# Patient Record
Sex: Male | Born: 1985 | Race: White | Hispanic: No | Marital: Single | Smoking: Current every day smoker
Health system: Southern US, Community
[De-identification: ages and names within clinical notes are randomized; demographics above are authoritative.]

## PROBLEM LIST (undated history)

## (undated) DIAGNOSIS — G5600 Carpal tunnel syndrome, unspecified upper limb: Secondary | ICD-10-CM

## (undated) DIAGNOSIS — F1111 Opioid abuse, in remission: Secondary | ICD-10-CM

## (undated) HISTORY — PX: HAND TENDON SURGERY: SHX663

## (undated) HISTORY — PX: ANKLE SURGERY: SHX546

## (undated) HISTORY — PX: MOUTH SURGERY: SHX715

---
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
## 2003-06-02 ENCOUNTER — Emergency Department (HOSPITAL_COMMUNITY): Admission: EM | Admit: 2003-06-02 | Discharge: 2003-06-02 | Payer: Self-pay | Admitting: Emergency Medicine

## 2010-01-19 ENCOUNTER — Ambulatory Visit
Admission: RE | Admit: 2010-01-19 | Discharge: 2010-01-19 | Payer: Self-pay | Source: Home / Self Care | Attending: Orthopedic Surgery | Admitting: Orthopedic Surgery

## 2010-01-29 NOTE — Op Note (Signed)
NAMEHARON, Leon [name]                    ACCOUNT NO.:  [id number]  MEDICAL RECORD NO.:  [id number]          PATIENT TYPE:  AMB  LOCATION:  DSC                          FACILITY:  [facility]  PHYSICIAN:  Betha Loa, MD        DATE OF BIRTH:  Sep 08, 1985  DATE OF PROCEDURE:  01/19/2010 DATE OF DISCHARGE:                              OPERATIVE REPORT   PREOPERATIVE DIAGNOSIS:  Right thumb digital nerve lacerations.  POSTOPERATIVE DIAGNOSIS:  Right thumb ulnar digital nerve laceration.  PROCEDURE:  Right thumb ulnar digital nerve repair.  SURGEON:  Betha Loa, MD  ASSISTANT:  Cindee Salt, MD  ANESTHESIA:  General.  IV FLUIDS:  Per anesthesia flow sheet.  ESTIMATED BLOOD LOSS:  Minimal.  COMPLICATIONS:  None.  SPECIMENS:  None.  TOURNIQUET TIME:  Forty four minutes.  DISPOSITION:  Stable to PACU.  INDICATIONS:  [name] is a 25 year old white male who last Thursday was at work with a piece of glass and the glass slipped lacerating the base of his right thumb.  He was seen at an outside facility and transferred to my office for my evaluation.  He had a laceration at the ulnar base of the thumb near the level of the MP joint.  He stated he had decreased sensation in the thumb tip.  He had good capillary refill.  His wound was irrigated and two sutures were placed.  He was followed up on Monday for reevaluation.  On reevaluation, he still had numbness in the tip of the thumb.  I recommended him going to the operating room for exploration of the wound and potential repair of nerve lacerations. Risks, benefits, and alternatives of surgery were discussed including risk of blood loss, infection, damage to nerves, vessels, tendons, ligaments, bone, failure of surgery, need for additional surgery, complications, wound healing, continued pain, continued numbness.  He voiced understanding of these risks and elected to proceed.  OPERATIVE COURSE:  After being identified preoperatively by  myself, the patient and I agreed upon the procedure and site of procedure.  The surgical site was marked.  The risks, benefits, and alternatives of surgery were reviewed and wished to proceed.  Surgical consent had been signed.  He was given 1 g of IV Ancef as a preoperative antibiotic prophylaxis.  He was transported to the operating room and placed on the operating table in supine position with the right upper extremity on armboard.  General anesthesia was induced by the anesthesiologist.  The right upper extremity was prepped and draped in normal sterile orthopedic fashion using Betadine scrub and paint.  Surgical pause was performed between surgeons, Anesthesia, operating staff, and all were in agreement as to the patient, procedure, and site of procedure. Tourniquet at the proximal aspect of the extremity was inflated to 250 mmHg after exsanguination of the limb with an Esmarch bandage.  The traumatic wound was opened.  There was noted to be a nearly complete laceration of the ulnar digital nerve to the thumb.  The wound was extended distally at the ulnar side and proximally at the radial side. This allowed  better visualization.  The radial digital nerve was identified and was intact throughout the zone of injury.  The princeps pollicis artery was identified and was intact.  The microscope was brought in.  The nerve ends were cleared.  A 9-0 suture was used in an interrupted fashion circumferentially to repair the nerve ends together. This provided appropriate approximation of the nerve ends.  Four sutures total were placed.  The wound was then copiously irrigated with sterile saline.  It was closed using 4-0 nylon in a horizontal mattress fashion. The wound was then injected with 3.5 mL of 0.25% plain Marcaine.  It was dressed with sterile Xeroform, 4 x 4's, and wrapped with Kerlix.  A thumb spica splint was placed and wrapped with Kerlix and Ace bandage. The tourniquet was deflated  at 44 minutes.  The thumb was pink with brisk capillary refill after deflation of the tourniquet.  Operative drapings were broken down.  The patient was awoken from anesthesia safely.  He was transferred back to the stretcher and taken to PACU in stable condition.  I will see him in the office in 1 week for postoperative follow up.  I will give him Percocet 5/325 one to two p.o. q.6 h. p.r.n. pain, dispense #40 for pain control.     Betha Loa, MD     KK/MEDQ  D:  01/19/2010  T:  01/20/2010  Job:  [id number]  Electronically Signed by Betha Loa  on 01/29/2010 04:00:25 PM

## 2014-06-10 ENCOUNTER — Emergency Department (HOSPITAL_BASED_OUTPATIENT_CLINIC_OR_DEPARTMENT_OTHER)
Admission: EM | Admit: 2014-06-10 | Discharge: 2014-06-10 | Disposition: A | Discharge status: Home / Self Care | Payer: Self-pay | Attending: Emergency Medicine | Admitting: Emergency Medicine

## 2014-06-10 ENCOUNTER — Encounter (HOSPITAL_BASED_OUTPATIENT_CLINIC_OR_DEPARTMENT_OTHER): Payer: Self-pay | Admitting: Emergency Medicine

## 2014-06-10 DIAGNOSIS — G5602 Carpal tunnel syndrome, left upper limb: Secondary | ICD-10-CM | POA: Insufficient documentation

## 2014-06-10 DIAGNOSIS — Z720 Tobacco use: Secondary | ICD-10-CM | POA: Insufficient documentation

## 2014-06-10 HISTORY — DX: Carpal tunnel syndrome, unspecified upper limb: G56.00

## 2014-06-10 MED ORDER — HYDROCODONE-ACETAMINOPHEN 5-325 MG PO TABS: 2.0000 | ORAL_TABLET | ORAL | Status: AC | PRN

## 2014-06-10 NOTE — Discharge Instructions (Signed)
Carpal Tunnel Syndrome The carpal tunnel is a narrow area located on the palm side of your wrist. The tunnel is formed by the wrist bones and ligaments. Nerves, blood vessels, and tendons pass through the carpal tunnel. Repeated wrist motion or certain diseases may cause swelling within the tunnel. This swelling pinches the main nerve in the wrist (median nerve) and causes the painful hand and arm condition called carpal tunnel syndrome. CAUSES   Repeated wrist motions.  Wrist injuries.  Certain diseases like arthritis, diabetes, alcoholism, hyperthyroidism, and kidney failure.  Obesity.  Pregnancy. SYMPTOMS   A "pins and needles" feeling in your fingers or hand, especially in your thumb, index and middle fingers.  Tingling or numbness in your fingers or hand.  An aching feeling in your entire arm, especially when your wrist and elbow are bent for long periods of time.  Wrist pain that goes up your arm to your shoulder.  Pain that goes down into your palm or fingers.  A weak feeling in your hands. DIAGNOSIS  Your health care provider will take your history and perform a physical exam. An electromyography test may be needed. This test measures electrical signals sent out by your nerves into the muscles. The electrical signals are usually slowed by carpal tunnel syndrome. You may also need X-rays. TREATMENT  Carpal tunnel syndrome may clear up by itself. Your health care provider may recommend a wrist splint or medicine such as a nonsteroidal anti-inflammatory medicine. Cortisone injections may help. Sometimes, surgery may be needed to free the pinched nerve.  HOME CARE INSTRUCTIONS   Take all medicine as directed by your health care provider. Only take over-the-counter or prescription medicines for pain, discomfort, or fever as directed by your health care provider.  If you were given a splint to keep your wrist from bending, wear it as directed. It is important to wear the splint at  night. Wear the splint for as long as you have pain or numbness in your hand, arm, or wrist. This may take 1 to 2 months.  Rest your wrist from any activity that may be causing your pain. If your symptoms are work-related, you may need to talk to your employer about changing to a job that does not require using your wrist.  Put ice on your wrist after long periods of wrist activity.  Put ice in a plastic bag.  Place a towel between your skin and the bag.  Leave the ice on for 15-20 minutes, 03-04 times a day.  Keep all follow-up visits as directed by your health care provider. This includes any orthopedic referrals, physical therapy, and rehabilitation. Any delay in getting necessary care could result in a delay or failure of your condition to heal. SEEK IMMEDIATE MEDICAL CARE IF:   You have new, unexplained symptoms.  Your symptoms get worse and are not helped or controlled with medicines. MAKE SURE YOU:   Understand these instructions.  Will watch your condition.  Will get help right away if you are not doing well or get worse. Document Released: 12/25/1999 Document Revised: 05/13/2013 Document Reviewed: 11/12/2010 ExitCare Patient Information 2015 [employer]. This information is not intended to replace advice given to you by your health care provider. Make sure you discuss any questions you have with your health care provider.  

## 2014-06-10 NOTE — ED Notes (Signed)
Pt called to registration x 2 no answer

## 2014-06-10 NOTE — ED Notes (Signed)
Pt in c/o hiccups x 1 day, but biggest complaint is arm pain from carpal tunnel. Has been seen for this and has appt with specialist, but pain is worse now.

## 2014-06-10 NOTE — ED Provider Notes (Signed)
CSN: [id number]     Arrival date & time 06/10/14  1951 History  This chart was scribed for Leon Nayobert Elmo Shumard, MD by Phillis HaggisGabriella Gaje, ED Scribe. This patient was seen in room MH07/MH07 and patient care was started at 10:29 PM.   Chief Complaint  Patient presents with  . Hiccups  . Arm Pain   The history is provided by the patient. No language interpreter was used.  HPI Comments: Leon [name] is a 29 y.o. male with a history of carpal tunnel who presents to the Emergency Department complaining of throbbing left arm and hand pain onset 2 days ago. He reports that pain worsens with moving his fingers, to palpation, or when he hangs his hand downwards. He states that he has previously seen a hand specialist 2 years ago but reports that his symptoms got better but he stopped going. He states that he has an appointment with a specialist on Monday in [place] but could not wait due to pain. 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 He reports that Tylenol #3 typically works for this type of pain.   Past Medical History  Diagnosis Date  . Carpal tunnel syndrome    Past Surgical History  Procedure Laterality Date  . Ankle surgery    . Mouth surgery    . Hand tendon surgery     History reviewed. No pertinent family history. History  Substance Use Topics  . Smoking status: Current Every Day Smoker  . Smokeless tobacco: Never Used  . Alcohol Use: No    Review of Systems A complete 10 system review of systems was obtained and all systems are negative except as noted in the HPI and PMH.   Allergies  Review of patient's allergies indicates no known allergies.  Home Medications   Prior to Admission medications   Medication Sig Start Date End Date Taking? Authorizing Provider  HYDROcodone-acetaminophen (NORCO/VICODIN) 5-325 MG per tablet Take 2 tablets by mouth every 4 (four) hours as needed. 06/10/14   Leon Nayobert Shantel Helwig,  MD   BP 135/87 mmHg  Pulse 67  Temp(Src) 98.3 F (36.8 C) (Oral)  Resp 20  Ht 6' (1.829 m)  Wt 180 lb (81.647 kg)  BMI 24.41 kg/m2  SpO2 100% Physical Exam  Constitutional: He is oriented to person, place, and time. He appears well-developed and well-nourished. No distress.  HENT:  Head: Normocephalic and atraumatic.  Eyes: Pupils are equal, round, and reactive to light.  Neck: Normal range of motion.  Cardiovascular: Normal rate and intact distal pulses.   Pulses:      Radial pulses are 2+ on the right side, and 2+ on the left side.       Posterior tibial pulses are 2+ on the right side, and 2+ on the left side.  Pulmonary/Chest: No respiratory distress.  Abdominal: Normal appearance. He exhibits no distension. There is no tenderness.  Musculoskeletal: Normal range of motion.  Neurological: He is alert and oriented to person, place, and time. No cranial nerve deficit.  Skin: Skin is warm and dry. No rash noted.  Psychiatric: He has a normal mood and affect. His behavior is normal.  Nursing note and vitals reviewed.   ED Course  Procedures (including critical care time) DIAGNOSTIC STUDIES: Oxygen Saturation is 100% on room air, normal by my interpretation.    COORDINATION OF CARE: 10:33 PM-Discussed treatment plan which includes hydrocodone with pt at  bedside and pt agreed to plan.   Labs Review Labs Reviewed - No data to display    MDM   Final diagnoses:  Carpal tunnel syndrome of left wrist    I personally performed the services described in this documentation, which was scribed in my presence. The recorded information has been reviewed and considered.    Leon Nay, MD 06/18/14 [phone]

## 2014-08-08 ENCOUNTER — Encounter (HOSPITAL_COMMUNITY): Payer: Self-pay | Admitting: *Deleted

## 2014-08-08 ENCOUNTER — Emergency Department (HOSPITAL_COMMUNITY)
Admission: EM | Admit: 2014-08-08 | Discharge: 2014-08-08 | Discharge status: Against Medical Advice | Payer: Self-pay | Attending: Emergency Medicine | Admitting: Emergency Medicine

## 2014-08-08 DIAGNOSIS — N179 Acute kidney failure, unspecified: Secondary | ICD-10-CM | POA: Insufficient documentation

## 2014-08-08 DIAGNOSIS — F1510 Other stimulant abuse, uncomplicated: Secondary | ICD-10-CM | POA: Insufficient documentation

## 2014-08-08 DIAGNOSIS — R51 Headache: Secondary | ICD-10-CM | POA: Insufficient documentation

## 2014-08-08 DIAGNOSIS — M6282 Rhabdomyolysis: Secondary | ICD-10-CM | POA: Insufficient documentation

## 2014-08-08 DIAGNOSIS — R5383 Other fatigue: Secondary | ICD-10-CM | POA: Insufficient documentation

## 2014-08-08 DIAGNOSIS — F1110 Opioid abuse, uncomplicated: Secondary | ICD-10-CM | POA: Insufficient documentation

## 2014-08-08 DIAGNOSIS — F1410 Cocaine abuse, uncomplicated: Secondary | ICD-10-CM | POA: Insufficient documentation

## 2014-08-08 DIAGNOSIS — Z720 Tobacco use: Secondary | ICD-10-CM | POA: Insufficient documentation

## 2014-08-08 DIAGNOSIS — Z8669 Personal history of other diseases of the nervous system and sense organs: Secondary | ICD-10-CM | POA: Insufficient documentation

## 2014-08-08 HISTORY — DX: Opioid abuse, in remission: F11.11

## 2014-08-08 LAB — URINE MICROSCOPIC-ADD ON

## 2014-08-08 LAB — BASIC METABOLIC PANEL
Anion gap: 8 (ref 5–15)
Anion gap: 6 (ref 5–15)
BUN: 21 mg/dL — ABNORMAL HIGH (ref 6–20)
BUN: 17 mg/dL (ref 6–20)
CO2: 30 mmol/L (ref 22–32)
CO2: 28 mmol/L (ref 22–32)
Calcium: 8.9 mg/dL (ref 8.9–10.3)
Calcium: 7.8 mg/dL — ABNORMAL LOW (ref 8.9–10.3)
Chloride: 103 mmol/L (ref 101–111)
Chloride: 104 mmol/L (ref 101–111)
Creatinine, Ser: 2.11 mg/dL — ABNORMAL HIGH (ref 0.61–1.24)
Creatinine, Ser: 1.59 mg/dL — ABNORMAL HIGH (ref 0.61–1.24)
GFR calc Af Amer: 46 mL/min — ABNORMAL LOW (ref >60)
GFR calc Af Amer: >60 mL/min (ref >60)
GFR calc non Af Amer: 40 mL/min — ABNORMAL LOW (ref >60)
GFR, EST NON AFRICAN AMERICAN: 56 mL/min — AB (ref >60)
GLUCOSE: 108 mg/dL — AB (ref 65–99)
Glucose, Bld: 117 mg/dL — ABNORMAL HIGH (ref 65–99)
POTASSIUM: 3.9 mmol/L (ref 3.5–5.1)
Potassium: 3.3 mmol/L — ABNORMAL LOW (ref 3.5–5.1)
Sodium: 141 mmol/L (ref 135–145)
Sodium: 138 mmol/L (ref 135–145)

## 2014-08-08 LAB — URINALYSIS, ROUTINE W REFLEX MICROSCOPIC
BILIRUBIN URINE: NEGATIVE
GLUCOSE, UA: NEGATIVE mg/dL
KETONES UR: NEGATIVE mg/dL
LEUKOCYTES UA: NEGATIVE
NITRITE: NEGATIVE
Protein, ur: 30 mg/dL — AB
Urobilinogen, UA: 1 mg/dL (ref 0.0–1.0)
pH: 6 (ref 5.0–8.0)

## 2014-08-08 LAB — CBC WITH DIFFERENTIAL/PLATELET
Basophils Absolute: 0 10*3/uL (ref 0.0–0.1)
Basophils Relative: 0 % (ref 0–1)
Eosinophils Absolute: 0 10*3/uL (ref 0.0–0.7)
Eosinophils Relative: 0 % (ref 0–5)
HCT: 43.1 % (ref 39.0–52.0)
Hemoglobin: 15.4 g/dL (ref 13.0–17.0)
Lymphocytes Relative: 15 % (ref 12–46)
Lymphs Abs: 1.6 10*3/uL (ref 0.7–4.0)
MCH: 30.2 pg (ref 26.0–34.0)
MCHC: 35.7 g/dL (ref 30.0–36.0)
MCV: 84.5 fL (ref 78.0–100.0)
Monocytes Absolute: 0.9 10*3/uL (ref 0.1–1.0)
Monocytes Relative: 8 % (ref 3–12)
Neutro Abs: 8.4 10*3/uL — ABNORMAL HIGH (ref 1.7–7.7)
Neutrophils Relative %: 77 % (ref 43–77)
Platelets: 191 10*3/uL (ref 150–400)
RBC: 5.1 MIL/uL (ref 4.22–5.81)
RDW: 13.5 % (ref 11.5–15.5)
WBC: 11 10*3/uL — ABNORMAL HIGH (ref 4.0–10.5)

## 2014-08-08 LAB — RAPID URINE DRUG SCREEN, HOSP PERFORMED
Amphetamines: POSITIVE — AB
BARBITURATES: NOT DETECTED
BENZODIAZEPINES: NOT DETECTED
Cocaine: POSITIVE — AB
OPIATES: POSITIVE — AB
Tetrahydrocannabinol: NOT DETECTED

## 2014-08-08 LAB — CK
Total CK: 509 U/L — ABNORMAL HIGH (ref 49–397)
Total CK: 434 U/L — ABNORMAL HIGH (ref 49–397)

## 2014-08-08 MED ORDER — SODIUM CHLORIDE 0.9 % IV SOLN
1000.0000 mL | Freq: Once | INTRAVENOUS | Status: AC
  Administered 2014-08-08: 1000 mL via INTRAVENOUS

## 2014-08-08 MED ORDER — SODIUM CHLORIDE 0.9 % IV SOLN: 1000.0000 mL | INTRAVENOUS | Status: DC

## 2014-08-08 MED ORDER — SODIUM CHLORIDE 0.9 % IV BOLUS (SEPSIS)
1000.0000 mL | Freq: Once | INTRAVENOUS | Status: AC
  Administered 2014-08-08: 1000 mL via INTRAVENOUS

## 2014-08-08 NOTE — ED Notes (Signed)
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 He went to a convenience store and drank 2 sodas really fast and vomited it back up. EMS reports that pt was initially orthostatic on scene. NS administered by EMS. Pt also c/o headache, asking for ibuprofen on arrival.

## 2014-08-08 NOTE — ED Notes (Signed)
Pt has now had total 3.5 liters NS and lab in to redraw.

## 2014-08-08 NOTE — ED Notes (Signed)
Patient wants to leave so he can smoke.  Encouraged patient to at least stay until labs just obtained are resulted. Patient agreed.

## 2014-08-08 NOTE — ED Notes (Signed)
PA talked with pt and pt agreed to stay and get treatment. Aware after 2nd liter pt will have blood redrawn.

## 2014-08-08 NOTE — ED Provider Notes (Signed)
CSN: [id number]     Arrival date & time 08/08/14  1833 History   First MD Initiated Contact with Patient 08/08/14 1841     Chief Complaint  Patient presents with  . Dehydration     (Consider location/radiation/quality/duration/timing/severity/associated sxs/prior Treatment) HPI Comments: Patient is a 29 year old Leon [name] who presents to the emergency department with a complaint of "possible dehydration".  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 The dump truck broke down, and he had to sit on the side of the road for about 4 hours. He was eventually able to get a ride onto a convenience store. He then drank 2, 20 ounce sodas rapidly. And states he vomited back up. He states that he was off balance and generally not feeling well, and someone called EMS for him. The EMS personnel also thought he may be suffering from dehydration, as he was noted to be orthostatic, and gave him 1500 mL of normal saline and brought him to the emergency department. The patient currently complains of headache, and is asking for something to drink. The patient denies any medical conditions. He denies drinking any alcohol in the last 24-48 hours. And he denies use of any recreational drugs.  The history is provided by the patient.    Past Medical History  Diagnosis Date  . Carpal tunnel syndrome   . Narcotic abuse in remission    Past Surgical History  Procedure Laterality Date  . Ankle surgery    . Mouth surgery    . Hand tendon surgery     No family history on file. History  Substance Use Topics  . Smoking status: Current Every Day Smoker  . Smokeless tobacco: Never Used  . Alcohol Use: No    Review of Systems  Constitutional: Positive for fatigue.  Neurological: Positive for dizziness and headaches.  All other systems reviewed and are negative.     Allergies  Review of patient's allergies indicates no known allergies.  Home Medications   Prior to Admission medications   Medication Sig  Start Date End Date Taking? Authorizing Provider  HYDROcodone-acetaminophen (NORCO/VICODIN) 5-325 MG per tablet Take 2 tablets by mouth every 4 (four) hours as needed. 06/10/14   Nelva Nay, MD   BP 106/69 mmHg  Pulse 89  Temp(Src) 97.8 F (36.6 C) (Oral)  Resp 13  SpO2 100% Physical Exam  Constitutional: He is oriented to person, place, and time. He appears well-developed and well-nourished.  Non-toxic appearance.  HENT:  Head: Normocephalic.  Right Ear: Tympanic membrane and external ear normal.  Left Ear: Tympanic membrane and external ear normal.  Mucous membranes moist.  Eyes: EOM and lids are normal. Pupils are equal, round, and reactive to light.  Neck: Normal range of motion. Neck supple. Carotid bruit is not present.  Cardiovascular: Normal rate, regular rhythm, normal heart sounds, intact distal pulses and normal pulses.   Pulmonary/Chest: Breath sounds normal. No respiratory distress.  Abdominal: Soft. Bowel sounds are normal. There is no tenderness. There is no guarding.  Musculoskeletal: Normal range of motion.  Lymphadenopathy:       Head (right side): No submandibular adenopathy present.       Head (left side): No submandibular adenopathy present.    He has no cervical adenopathy.  Neurological: He is alert and oriented to person, place, and time. He has normal strength. No cranial nerve deficit or sensory deficit.  Skin: Skin is warm and dry.  Psychiatric: He has a normal mood and affect. His speech  is normal.  Nursing note and vitals reviewed.   ED Course  Discussed the initial lab findings with the patient, including CK elevated at 509, and creatinine elevated at 2.11. Review of previous emergency department records reveal the patient's previous creatinines have been within normal limits. Also discussed the potassium being 3.3. The patient is currently receiving IV fluids. I discussed with him the need to have additional fluids.  Called to the patient's room  because he wanted to leave. I discussed with the patient the importance of receiving fluids especially related to his thumb abnormal renal function. The patient states that he was concerned that a power findings might lead to him leaving losing his [place] license. Initially he told me that he had not used any recreational drugs. He now states that he used a little marijuana over the weekend. I discussed with the patient the hospital privacy act. The patient then decided that he would stay and received additional fluids.  Patient receiving additional IV fluids, with pending CK, as well as basic metabolic panel. The patient stated he wanted to leave again. He states that he has ADHD, and obsessive-compulsive disorder, and that he cannot stand being still does long. The patient states that he wanted to go smoke, and if he wants to leave. Patient was again asked to allow [place] the opportunity to take care of him especially related to his abnormal lab work. The patient chose to leave AGAINST MEDICAL ADVICE.   Procedures (including critical care time) Labs Review Labs Reviewed  CBC WITH DIFFERENTIAL/PLATELET - Abnormal; Notable for the following:    WBC 11.0 (*)    Neutro Abs 8.4 (*)    All other components within normal limits  URINALYSIS, ROUTINE W REFLEX MICROSCOPIC (NOT AT [facility])  BASIC METABOLIC PANEL  CK    Imaging Review No results found.   EKG Interpretation None      MDM  Vital signs reviewed. Suspect dehydration, but will check for evidence of rhabdomyolysis.  CK elevated at 509. The BUN is elevated at 21, creatinine elevated at 2.11. Discussed these findings with the patient. Additional IV fluids ordered. Pt states he is feeling better.  Patient chose to leave the hospital AGAINST MEDICAL ADVICE before the remainder of his IV fluids could be finished, or the results of his repeat CK, or his basic metabolic panel on could return.  Urine drug screen was positive for opiates, positive for  cocaine, positive for that means. The patient had previously denied that he had used any recreational drugs.  The patient chose to leave the hospital AGAINST MEDICAL ADVICE. He states that he may return later if any symptoms return.    Final diagnoses:  None    *I have reviewed nursing notes, vital signs, and all appropriate lab and imaging results for this patient.[address] PA-C 08/09/14 1610  Glynn Octave, MD 08/09/14 [phone]

## 2014-08-08 NOTE — ED Notes (Signed)
Pt was polite but stated he is ready to go. Wanted IV out. Made patient aware of risks if leaves AMA. Patient still wanted to leave. IV taken out. Information given to pt about My chart to look at his labs tomorrow

## 2014-08-09 ENCOUNTER — Encounter (HOSPITAL_COMMUNITY): Payer: Self-pay | Admitting: *Deleted

## 2014-08-09 ENCOUNTER — Emergency Department (HOSPITAL_COMMUNITY)
Admission: EM | Admit: 2014-08-09 | Discharge: 2014-08-09 | Disposition: A | Discharge status: Home / Self Care | Payer: Self-pay | Attending: Emergency Medicine | Admitting: Emergency Medicine

## 2014-08-09 DIAGNOSIS — T401X1A Poisoning by heroin, accidental (unintentional), initial encounter: Secondary | ICD-10-CM | POA: Insufficient documentation

## 2014-08-09 DIAGNOSIS — Y929 Unspecified place or not applicable: Secondary | ICD-10-CM | POA: Insufficient documentation

## 2014-08-09 DIAGNOSIS — R000 Tachycardia, unspecified: Secondary | ICD-10-CM | POA: Insufficient documentation

## 2014-08-09 DIAGNOSIS — Z8739 Personal history of other diseases of the musculoskeletal system and connective tissue: Secondary | ICD-10-CM | POA: Insufficient documentation

## 2014-08-09 DIAGNOSIS — Y999 Unspecified external cause status: Secondary | ICD-10-CM | POA: Insufficient documentation

## 2014-08-09 DIAGNOSIS — Y939 Activity, unspecified: Secondary | ICD-10-CM | POA: Insufficient documentation

## 2014-08-09 DIAGNOSIS — Z720 Tobacco use: Secondary | ICD-10-CM | POA: Insufficient documentation

## 2014-08-09 MED ORDER — ONDANSETRON 4 MG PO TBDP
4.0000 mg | ORAL_TABLET | Freq: Once | ORAL | Status: AC
  Administered 2014-08-09: 4 mg via ORAL
  Filled 2014-08-09: qty 1

## 2014-08-09 NOTE — ED Provider Notes (Signed)
CSN: [id number]     Arrival date & time 08/09/14  1608 History   First MD Initiated Contact with Patient 08/09/14 1623     Chief Complaint  Patient presents with  . Heroin OD      (Consider location/radiation/quality/duration/timing/severity/associated sxs/prior Treatment) HPI Comments: Patient saw a friend today while he was at the mall. In the past he has used here when occasionally but states that he typically will get high on pain pills. He went to the bathroom and mixed heroin with water and drank it. He then apparently became unresponsive. EMS was called and when they arrived patient was in respiratory arrest and blue. They administered 2 mg of Narcan intranasally and patient became responsive immediately. Patient denies any suicidal ideation and this was just awaiting get high.  The history is provided by the patient.    Past Medical History  Diagnosis Date  . Carpal tunnel syndrome   . Narcotic abuse in remission    Past Surgical History  Procedure Laterality Date  . Ankle surgery    . Mouth surgery    . Hand tendon surgery     No family history on file. History  Substance Use Topics  . Smoking status: Current Every Day Smoker  . Smokeless tobacco: Never Used  . Alcohol Use: No    Review of Systems  All other systems reviewed and are negative.     Allergies  Review of patient's allergies indicates no known allergies.  Home Medications   Prior to Admission medications   Medication Sig Start Date End Date Taking? Authorizing Provider  HYDROcodone-acetaminophen (NORCO/VICODIN) 5-325 MG per tablet Take 2 tablets by mouth every 4 (four) hours as needed. Patient not taking: Reported on 08/08/2014 06/10/14   Nelva Nay, MD   BP 171/108 mmHg  Pulse 105  Temp(Src) 98.1 F (36.7 C)  Resp 18  SpO2 100% Physical Exam  Constitutional: He is oriented to person, place, and time. He appears well-developed and well-nourished. No distress.  HENT:  Head: Normocephalic  and atraumatic.  Mouth/Throat: Oropharynx is clear and moist.  Resolved epistaxis  Eyes: Conjunctivae and EOM are normal. Pupils are equal, round, and reactive to light.  Neck: Normal range of motion. Neck supple.  Cardiovascular: Regular rhythm and intact distal pulses.  Tachycardia present.   No murmur heard. Pulmonary/Chest: Effort normal and breath sounds normal. No respiratory distress. He has no wheezes. He has no rales.  Abdominal: Soft. He exhibits no distension. There is no tenderness. There is no rebound and no guarding.  Musculoskeletal: Normal range of motion. He exhibits no edema or tenderness.  Neurological: He is alert and oriented to person, place, and time.  Skin: Skin is warm and dry. No rash noted. No erythema.  Psychiatric: He has a normal mood and affect. His behavior is normal. He expresses no homicidal and no suicidal ideation.  tearful  Nursing note and vitals reviewed.   ED Course  Procedures (including critical care time) Labs Review Labs Reviewed - No data to display  Imaging Review No results found.   EKG Interpretation None      MDM   Final diagnoses:  Heroin overdose, accidental or unintentional, initial encounter    Patient presenting today after a accidental heroin overdose. When EMS arrived patient was in respiratory arrest after 2 of intranasal Narcan patient became alert oriented, combative. Patient hears just complaining of nausea vomiting and aches and pains. He is awake alert and oriented. He denies any suicidal thoughts.  Feel that this was an accidental heroin overdose. He is otherwise well. No further intervention needed. Will monitor patient for the next hour to ensure that he is feeling okay for discharge home.    Gwyneth Sprout, MD 08/09/14 [phone]

## 2014-08-09 NOTE — ED Notes (Signed)
Bed: RESA Expected date: 08/09/14 Expected time: 4:11 PM Means of arrival: Ambulance Comments: Heroin Overdose, Narcan given

## 2014-08-09 NOTE — ED Notes (Signed)
Pt found in bathroom unresponsive, Heroin overdose, 2.5 mg Narcan given, pt alert and oriented, ambulating on arrival, pt was in resp arrest, blue in color, mixed water with heroin swallowed by mouth. HTN, vomiting. No intent to harm self.

## 2014-08-09 NOTE — Discharge Instructions (Signed)
Accidental Overdose °A drug overdose occurs when a chemical substance (drug or medication) is used in amounts large enough to overcome a person. This may result in severe illness or death. This is a type of poisoning. Accidental overdoses of medications or other substances come from a variety of reasons. When this happens accidentally, it is often because the person taking the substance does not know enough about what they have taken. Drugs which commonly cause overdose deaths are alcohol, psychotropic medications (medications which affect the mind), pain medications, illegal drugs (street drugs) such as cocaine and heroin, and multiple drugs taken at the same time. It may result from careless behavior (such as over-indulging at a party). Other causes of overdose may include multiple drug use, a lapse in memory, or drug use after a period of no drug use.  °Sometimes overdosing occurs because a person cannot remember if they have taken their medication.  °A common unintentional overdose in young children involves multi-vitamins containing iron. Iron is a part of the hemoglobin molecule in blood. It is used to transport oxygen to living cells. When taken in small amounts, iron allows the body to restock hemoglobin. In large amounts, it causes problems in the body. If this overdose is not treated, it can lead to death. °Never take medicines that show signs of tampering or do not seem quite right. Never take medicines in the dark or in poor lighting. Read the label and check each dose of medicine before you take it. When adults are poisoned, it happens most often through carelessness or lack of information. Taking medicines in the dark or taking medicine prescribed for someone else to treat the same type of problem is a dangerous practice. °SYMPTOMS  °Symptoms of overdose depend on the medication and amount taken. They can vary from over-activity with stimulant over-dosage, to sleepiness from depressants such as  alcohol, narcotics and tranquilizers. Confusion, dizziness, nausea and vomiting may be present. If problems are severe enough coma and death may result. °DIAGNOSIS  °Diagnosis and management are generally straightforward if the drug is known. Otherwise it is more difficult. At times, certain symptoms and signs exhibited by the patient, or blood tests, can reveal the drug in question.  °TREATMENT  °In an emergency department, most patients can be treated with supportive measures. Antidotes may be available if there has been an overdose of opioids or benzodiazepines. A rapid improvement will often occur if this is the cause of overdose. °At home or away from medical care: °· There may be no immediate problems or warning signs in children. °· Not everything works well in all cases of poisoning. °· Take immediate action. Poisons may act quickly. °· If you think someone has swallowed medicine or a household product, and the person is unconscious, having seizures (convulsions), or is not breathing, immediately call for an ambulance. °IF a person is conscious and appears to be doing OK but has swallowed a poison: °· Do not wait to see what effect the poison will have. Immediately call a poison control center (listed in the white pages of your telephone book under "Poison Control" or inside the front cover with other emergency numbers). Some poison control centers have TTY capability for the deaf. Check with your local center if you or someone in your family requires this service. °· Keep the container so you can read the label on the product for ingredients. °· Describe what, when, and how much was taken and the age and condition of the person poisoned.   Inform them if the person is vomiting, choking, drowsy, shows a change in color or temperature of skin, is conscious or unconscious, or is convulsing. °· Do not cause vomiting unless instructed by medical personnel. Do not induce vomiting or force liquids into a person who  is convulsing, unconscious, or very drowsy. °Stay calm and in control.  °· Activated charcoal also is sometimes used in certain types of poisoning and you may wish to add a supply to your emergency medicines. It is available without a prescription. Call a poison control center before using this medication. °PREVENTION  °Thousands of children die every year from unintentional poisoning. This may be from household chemicals, poisoning from carbon monoxide in a car, taking their parent's medications, or simply taking a few iron pills or vitamins with iron. Poisoning comes from unexpected sources. °· Store medicines out of the sight and reach of children, preferably in a locked cabinet. Do not keep medications in a food cabinet. Always store your medicines in a secure place. Get rid of expired medications. °· If you have children living with you or have them as occasional guests, you should have child-resistant caps on your medicine containers. Keep everything out of reach. Child proof your home. °· If you are called to the telephone or to answer the door while you are taking a medicine, take the container with you or put the medicine out of the reach of small children. °· Do not take your medication in front of children. Do not tell your child how good a medication is and how good it is for them. They may get the idea it is more of a treat. °· If you are an adult and have accidentally taken an overdose, you need to consider how this happened and what can be done to prevent it from happening again. If this was from a street drug or alcohol, determine if there is a problem that needs addressing. If you are not sure a problems exists, it is easy to talk to a professional and ask them if they think you have a problem. It is better to handle this problem in this way before it happens again and has a much worse consequence. °Document Released: 03/12/2004 Document Revised: 03/21/2011 Document Reviewed: 08/18/2008 °ExitCare®  Patient Information ©2015 [employer]. This information is not intended to replace advice given to you by your health care provider. Make sure you discuss any questions you have with your health care provider. ° °Emergency Department Resource Guide °1) Find a Doctor and Pay Out of Pocket °Although you won't have to find out who is covered by your insurance plan, it is a good idea to ask around and get recommendations. You will then need to call the office and see if the doctor you have chosen will accept you as a new patient and what types of options they offer for patients who are self-pay. Some doctors offer discounts or will set up payment plans for their patients who do not have insurance, but you will need to ask so you aren't surprised when you get to your appointment. ° °2) Contact Your Local Health Department °Not all health departments have doctors that can see patients for sick visits, but many do, so it is worth a call to see if yours does. If you don't know where your local health department is, you can check in your phone book. The CDC also has a tool to help you locate your state's health department, and many state websites   also have listings of all of their local health departments. ° °3) Find a Walk-in Clinic °If your illness is not likely to be very severe or complicated, you may want to try a walk in clinic. These are popping up all over the country in pharmacies, drugstores, and shopping centers. They're usually staffed by nurse practitioners or physician assistants that have been trained to treat common illnesses and complaints. They're usually fairly quick and inexpensive. However, if you have serious medical issues or chronic medical problems, these are probably not your best option. ° °No Primary Care Doctor: °- Call [facility] at  832-8000 - they can help you locate a primary care doctor that  accepts your insurance, provides certain services, etc. °- Physician Referral Service-  1-800-533-3463 ° °Chronic Pain Problems: °Organization         Address  Phone   Notes  °[facility]  (336) [phone] Patients need to be referred by their primary care doctor.  ° °Medication Assistance: °Organization         Address  Phone   Notes  °[place] [facility] [address] °[place], [place] [address] (336) 641-8030 --Must be a resident of [place] °-- Must have NO insurance coverage whatsoever (no Medicaid/ Medicare, etc.) °-- The pt. MUST have a primary care doctor that directs their care regularly and follows them in the community °  °[facility]  (866) 331-1348   °[employer]  (888) 892-1162   ° °Agencies that provide inexpensive medical care: °Organization         Address  Phone   Notes  °[facility]  (336) 832-8035   ° Internal Medicine    (336) 832-7272   °[facility] [address] °[place], [place] [address] (336) 832-4777   °[facility] [address], °[place] (336) 271-4999   °[facility]    (336) 373-0678   °[facility]    (336) 272-1050   °[facility] ° [address], [place] Phone:  (336) 832-4444, Fax:  (336) 832-4440 Hours of Operation:  9 am - 6 pm, M-F.  Also accepts Medicaid/Medicare and self-pay.  °[facility] ° [address], [place] Phone: (336) 832-3150, Fax: (336) [phone]. Hours of Operation:  8:30 am - 5:30 pm, M-F.  Also accepts Medicaid and self-pay.  °[facility] [place] [address], [place] Phone: (336) 878-6027   °[facility] [address], [place], [place] (336)723-1848, Ext. 123 Mondays & Thursdays: 7-9 AM.  First 15 patients are seen on a first come, first serve basis. °  ° °Medicaid-accepting [place] Providers: ° °Organization         Address  Phone   Notes  °[facility] [address],  [place] (336) 641-2100 Also accepts self-pay patients.  °[facility] [address], [place] ° (336) 856-9996   °[facility] [address], [place] (336) 288-8857   °[facility] [address] [place] [address], [place] (336) 299-7000   °Veita Bland [address], [place]  ° (336) 373-1557 Only accepts [place] Access Medicaid patients after they have their name applied to their card.  ° °Self-Pay (no insurance) in [place]: ° °Organization         Address  Phone     Notes  °Sickle Cell Patients, [facility] [address], [place] (336) 832-1970   °[facility] [address], [place] (336) 832-4400   ° [facility] ° [address],  (336) 992-4800   °[facility]/Dr. Osei-Bonsu ° [address] [place] [address], [place] or [address], [place] (336) [phone] Phone number for both [place] and [place] locations is the same.  °[facility] [address], [place] (336) 299-0000   °[facility] [address] [place] [address], [place] or [address] (336) 852-7530 °(336) 878-2260   °[facility] [address], [place] (336) 350-1642, phone; (336) 294-5005, fax Sees patients 1st and 3rd Saturday of every month.  Must not qualify for public or private insurance (i.e. Medicaid, Medicare, [place] Health Choice, Veterans' Benefits) • Household income should be no more than 200% of the poverty level •The clinic cannot treat you if you are pregnant or think you are pregnant • Sexually transmitted diseases are not treated at the clinic.  ° ° °Dental Care: °Organization         Address  Phone  Notes  °[facility] [address], [place] (336) 641-6152 Accepts children up to age 21 who are enrolled in  Medicaid or [place] Health Choice; pregnant women with a Medicaid card; and children who have applied for Medicaid or [place] Health Choice, but were declined, whose parents can pay a reduced fee at time of service.  °[facility] [place]  [address], [place] (336) 641-7733 Accepts children up to age 21 who are enrolled in Medicaid or [place] Health Choice; pregnant women with a Medicaid card; and children who have applied for Medicaid or [place] Health Choice, but were declined, whose parents can pay a reduced fee at time of service.  °[facility] ° [address], [place] (336) 641-4533 Patients are seen by appointment only. Walk-ins are not accepted. [facility] will see patients 18 years of age and older. °Monday - Tuesday (8am-5pm) °Most Wednesdays (8:30-5pm) °$30 per visit, cash only  °[facility] ° [address], [place] (336) 641-4533 Patients are seen by appointment only. Walk-ins are not accepted. [facility] will see patients 18 years of age and older. °One Wednesday Evening (Monthly: Volunteer Based).  $30 per visit, cash only  °[facility]  (919) [phone] for adults; Children under age 4, call Graduate Pediatric Dentistry at ([phone]) [phone]. Children aged 4-14, please call ([phone]) [phone] to request a pediatric application. ° Dental services are provided in all areas of dental care including fillings, crowns and bridges, complete and partial dentures, implants, gum treatment, root canals, and extractions. Preventive care is also provided. Treatment is provided to both adults and children. °Patients are selected via a lottery and there is often a waiting list. °  °[facility] [address], ° ° (336) 763-8833 [email or web address] °  °[facility] [address], [place], [place] (336)723-1848, Ext. 123 Second and Fourth Thursday of each month, opens at 6:30  AM; Clinic ends at 9 AM.  Patients are seen on a first-come first-served basis, and a limited number are seen during each clinic.  ° °[facility] ° [address], [place], [place] (336) [phone]     Eligibility Requirements °You must have lived in [place], [place], or [place] counties for at least the last three months. °  You cannot be eligible for state or federal sponsored healthcare insurance, including Veterans Administration, Medicaid, or Medicare. °  You generally cannot be eligible for healthcare insurance through your employer.  °  How to apply: °Eligibility screenings are held every Tuesday and Wednesday afternoon from 1:00 pm until 4:00 pm. You do not need an appointment for the interview!  °[facility] [address], [place], [place] 336-631-2330   °[facility]  336-342-8273   °[facility]  336-703-3100   °[facility]  336-570-6415   ° °Behavioral Health Resources in the Community: °Intensive Outpatient Programs °Organization         Address  Phone  Notes  °[facility] [address], [place], [place] 336-878-6098   °[facility] [address], [place], [place] 336-832-9800   °[facility] [address], [place], [place] ° 336-882-2125   °[facility] [address],  °[place], [place] 1-800-853-5163 or 336-641-4981   °Substance Abuse Resources °Organization         Address  Phone  Notes  °[facility]  336-882-2125   °[facility]  336-784-9470   °[facility]  336-285-9073   °[facility]  336-845-3988   °Residential & Outpatient Substance Abuse Program  1-800-659-3381   °Psychological Services °Organization         Address  Phone  Notes  °[facility]  336- 832-9600   °[facility]  336- 378-7881   °[facility] [address], [place] 1-800-853-5163 or  336-641-4981   ° °Mobile Crisis Teams °Organization         Address  Phone  Notes  °[facility], Mobile Crisis Care Unit  1-877-626-1772   °Assertive °[facility] ° [address] [place], [place] 336-834-9664   °Sharon DeEsch [address] °[place] 336-554-5454   ° °Self-Help/Support Groups °Organization         Address  Phone             Notes  °[facility] - variety of support groups  336- 373-1402 Call for more information  °Narcotics Anonymous (NA), [facility] [address], °[place]  2 meetings at this location  ° °Residential Treatment Programs °Organization         Address  Phone  Notes  °[facility] [address],    °[place]  1-866-801-8205   °[facility] ° [address], [place], [place] 704-293-8524   °[facility] [address], [place] 336-845-3988 Admissions: 8am-3pm M-F  °[facility] [address]    °[place], [place] 336-841-1104   °[facility] [address], [place], [place] 336-379-7146   °[facility] [address]  °[place], [place] 336-285-9073   °[facility] - Intensive Outpatient [address], [place], [place] 336-852-3033   °[facility] [address]  °[place], [place] 1-877-615-2722 or 336-784-9470   °[facility] [address] [place], [place] 336-227-7417 Accepts Medicaid  °[facility] [address]  ° [place] 1-800-659-3381 Substance Abuse/Addiction Treatment  ° °[place] Behavioral Health Resources °Organization           Address  Phone  Notes  °[facility]  (888) 581-9988   °Julie Brannon, PhD [address] [place], [place]   (336) 349-5553 or (336) 951-0000   °[facility]   [address] °[place], [place] (336) 349-4454   °[facility] [address],  [place], [place] (336) 342-8316 Insurance/Medicaid/sponsorship through [facility]  °[facility] [address]                                    [place], [place] (336) 342-8316 Therapy/tele-psych/case  °[facility] [address]  ° [place], [place] (336) 349-2233    °Dr. Arfeen  (336) 349-4544   °[facility] [place]  [employer] [place] [facility] 1) [address], [place] °2) [address], [place] °3)  [address], [place] (336) 349-3220 °(336) 342-7768 ° °(336) 342-8140   °[place] [facility] (336) 342-1394 or (336) 342-3537 (After Hours)    ° ° °

## 2014-08-09 NOTE — ED Notes (Signed)
[facility] explained in detail, given specific information on substance abuse resources. Resource guide given. No other c/c. Ambulatory with steady gait. A&Ox4. No other c/c.

## 2015-02-27 ENCOUNTER — Emergency Department (HOSPITAL_COMMUNITY): Payer: Self-pay

## 2015-02-27 ENCOUNTER — Encounter (HOSPITAL_COMMUNITY): Payer: Self-pay | Admitting: *Deleted

## 2015-02-27 ENCOUNTER — Emergency Department (HOSPITAL_COMMUNITY)
Admission: EM | Admit: 2015-02-27 | Discharge: 2015-02-28 | Disposition: A | Discharge status: Home / Self Care | Payer: Self-pay | Attending: Emergency Medicine | Admitting: Emergency Medicine

## 2015-02-27 DIAGNOSIS — K5900 Constipation, unspecified: Secondary | ICD-10-CM | POA: Insufficient documentation

## 2015-02-27 DIAGNOSIS — R112 Nausea with vomiting, unspecified: Secondary | ICD-10-CM | POA: Insufficient documentation

## 2015-02-27 DIAGNOSIS — G5600 Carpal tunnel syndrome, unspecified upper limb: Secondary | ICD-10-CM | POA: Insufficient documentation

## 2015-02-27 DIAGNOSIS — F17200 Nicotine dependence, unspecified, uncomplicated: Secondary | ICD-10-CM | POA: Insufficient documentation

## 2015-02-27 LAB — CBC
HEMATOCRIT: 42.2 % (ref 39.0–52.0)
HEMOGLOBIN: 15.2 g/dL (ref 13.0–17.0)
MCH: 29.9 pg (ref 26.0–34.0)
MCHC: 36 g/dL (ref 30.0–36.0)
MCV: 82.9 fL (ref 78.0–100.0)
PLATELETS: 180 10*3/uL (ref 150–400)
RBC: 5.09 MIL/uL (ref 4.22–5.81)
RDW: 13.3 % (ref 11.5–15.5)
WBC: 6 10*3/uL (ref 4.0–10.5)

## 2015-02-27 LAB — COMPREHENSIVE METABOLIC PANEL
ALT: 16 U/L — ABNORMAL LOW (ref 17–63)
ANION GAP: 8 (ref 5–15)
AST: 22 U/L (ref 15–41)
Albumin: 3.9 g/dL (ref 3.5–5.0)
Alkaline Phosphatase: 58 U/L (ref 38–126)
BUN: 13 mg/dL (ref 6–20)
CO2: 26 mmol/L (ref 22–32)
CREATININE: 1.05 mg/dL (ref 0.61–1.24)
Calcium: 9 mg/dL (ref 8.9–10.3)
Chloride: 103 mmol/L (ref 101–111)
Glucose, Bld: 126 mg/dL — ABNORMAL HIGH (ref 65–99)
POTASSIUM: 3.4 mmol/L — AB (ref 3.5–5.1)
Sodium: 137 mmol/L (ref 135–145)
Total Bilirubin: 0.3 mg/dL (ref 0.3–1.2)
Total Protein: 7.1 g/dL (ref 6.5–8.1)

## 2015-02-27 LAB — URINALYSIS, ROUTINE W REFLEX MICROSCOPIC
Bilirubin Urine: NEGATIVE
GLUCOSE, UA: NEGATIVE mg/dL
Hgb urine dipstick: NEGATIVE
Ketones, ur: NEGATIVE mg/dL
LEUKOCYTES UA: NEGATIVE
Nitrite: NEGATIVE
PROTEIN: NEGATIVE mg/dL
SPECIFIC GRAVITY, URINE: 1.015 (ref 1.005–1.030)
pH: 6.5 (ref 5.0–8.0)

## 2015-02-27 LAB — POC OCCULT BLOOD, ED: Fecal Occult Bld: NEGATIVE

## 2015-02-27 LAB — LIPASE, BLOOD: Lipase: 31 U/L (ref 11–51)

## 2015-02-27 MED ORDER — SODIUM CHLORIDE 0.9 % IV BOLUS (SEPSIS)
1000.0000 mL | Freq: Once | INTRAVENOUS | Status: AC
  Administered 2015-02-27: 1000 mL via INTRAVENOUS

## 2015-02-27 MED ORDER — DIATRIZOATE MEGLUMINE & SODIUM 66-10 % PO SOLN
ORAL | Status: AC
  Filled 2015-02-27: qty 30

## 2015-02-27 NOTE — ED Provider Notes (Signed)
CSN: [id number]     Arrival date & time 02/27/15  2053 History  By signing my name below, I, Budd Palmer, attest that this documentation has been prepared under the direction and in the presence of Donnetta Hutching, MD. Electronically Signed: Budd Palmer, ED Scribe. 02/27/2015. 10:30 PM.     Chief Complaint  Patient presents with  . Abdominal Pain   The history is provided by the patient. No language interpreter was used.   HPI Comments: Leon [name] is a 30 y.o. male smoker with a PMHx of narcotic abuse (in remission) who presents to the Emergency Department complaining of constant, diffuse abdominal pain onset 1 week ago. He reports associated constipation, nausea, vomiting (1x, yesterday), and tasting feces after burping or vomiting. He notes he has not had a normal BM in the past 32 days, he states he has had 2 very small BM's yesterday after straining for a long time. He states he is able to pass gas, but does not feel the urge to have a BM. He states he has been eating less for the pst few days, due to concern over being constipated and not wanting to add to the issue. He denies a PMHx of the same. He reports a PMHx of colonoscopy at age 1 or 50, but does not recall what this was for. He notes a PMHx of kidney stones. He denies any other medical issues and a PSHx of the abdomen. Pt denies fever.    Past Medical History  Diagnosis Date  . Carpal tunnel syndrome   . Narcotic abuse in remission    Past Surgical History  Procedure Laterality Date  . Ankle surgery    . Mouth surgery    . Hand tendon surgery     History reviewed. No pertinent family history. Social History  Substance Use Topics  . Smoking status: Current Every Day Smoker  . Smokeless tobacco: Never Used  . Alcohol Use: No    Review of Systems  Gastrointestinal: Positive for nausea, vomiting, abdominal pain and constipation.  All other systems reviewed and are negative.   Allergies  Review of patient's  allergies indicates no known allergies.  Home Medications   Prior to Admission medications   Medication Sig Start Date End Date Taking? Authorizing Provider  aspirin-acetaminophen-caffeine (EXCEDRIN MIGRAINE) [phone] MG per tablet Take 2 tablets by mouth every 6 (six) hours as needed for headache or migraine.   Yes Historical Provider, MD  oxyCODONE-acetaminophen (PERCOCET/ROXICET) 5-325 MG tablet Take 1 tablet by mouth daily as needed for moderate pain or severe pain (carpal tunnel).   Yes Historical Provider, MD  HYDROcodone-acetaminophen (NORCO/VICODIN) 5-325 MG per tablet Take 2 tablets by mouth every 4 (four) hours as needed. Patient not taking: Reported on 08/08/2014 06/10/14   Nelva Nay, MD   BP 148/76 mmHg  Pulse 78  Temp(Src) 97.8 F (36.6 C) (Oral)  Resp 20  Ht 6' (1.829 m)  Wt 180 lb (81.647 kg)  BMI 24.41 kg/m2  SpO2 100% Physical Exam  Constitutional: He is oriented to person, place, and time. He appears well-developed and well-nourished.  HENT:  Head: Normocephalic and atraumatic.  Eyes: Conjunctivae and EOM are normal. Pupils are equal, round, and reactive to light.  Neck: Normal range of motion. Neck supple.  Cardiovascular: Normal rate and regular rhythm.   Pulmonary/Chest: Effort normal and breath sounds normal.  Abdominal: Soft. Bowel sounds are normal.  Genitourinary:  Rectal exam reveals no masses. No stool palpated. Heme-negative.  Musculoskeletal: Normal  range of motion.  Neurological: He is alert and oriented to person, place, and time.  Skin: Skin is warm and dry.  Psychiatric: He has a normal mood and affect. His behavior is normal.  Nursing note and vitals reviewed.   ED Course  Procedures  DIAGNOSTIC STUDIES: Oxygen Saturation is 100% on RA, normal by my interpretation.    COORDINATION OF CARE: 10:19 PM - Discussed plans to order a CT scan and diagnostic studies. Pt advised of plan for treatment and pt agrees.  Labs Review Labs Reviewed   COMPREHENSIVE METABOLIC PANEL - Abnormal; Notable for the following:    Potassium 3.4 (*)    Glucose, Bld 126 (*)    ALT 16 (*)    All other components within normal limits  LIPASE, BLOOD  CBC  URINALYSIS, ROUTINE W REFLEX MICROSCOPIC (NOT AT [facility])  POC OCCULT BLOOD, ED    Imaging Review Ct Abdomen Pelvis W Contrast  02/28/2015  CLINICAL DATA:  Constant diffuse abdominal pain for 1 week. Constipation, nausea, and vomiting. EXAM: CT ABDOMEN AND PELVIS WITH CONTRAST TECHNIQUE: Multidetector CT imaging of the abdomen and pelvis was performed using the standard protocol following bolus administration of intravenous contrast. CONTRAST:  OMNIPAQUE IOHEXOL 300 MG/ML  SOLN COMPARISON:  None. FINDINGS: Mild dependent changes in the lung bases. The liver, spleen, gallbladder, pancreas, adrenal glands, abdominal aorta, inferior vena cava, and retroperitoneal lymph nodes are unremarkable. 9 mm stone in the mid pole of the right kidney. No hydronephrosis or hydroureter. Renal nephrograms are symmetrical. The stomach is diffusely distended with ingested food material. No gastric wall thickening is appreciated. Small bowel are not abnormally distended. Contrast material flows to the distal small bowel without evidence of obstruction. Diffusely stool and gas filled colon. No colonic distention or wall thickening. No free air or free fluid in the abdomen. Abdominal wall musculature appears intact. Pelvis: Appendix is normal. Prostate gland is not enlarged. Bladder wall is thickened, possibly due to under distention although cystitis should be excluded clinically. No free or loculated pelvic fluid collections. No pelvic mass or lymphadenopathy. Stool and gas filled rectosigmoid colon without abnormal distention. No evidence of diverticulitis. Spondylolysis with mild spondylolisthesis at L5-S1. No destructive bone lesions. IMPRESSION: Stomach is distended with food. Colon is filled with stool and gas but without  distention. No bowel obstruction. Thickening of the bladder wall may be due to under distention but cystitis is not excluded. Nonobstructing stone in the right kidney. Electronically Signed   By: Burman Nieves M.D.   On: 02/28/2015 00:14   Dg Abd Acute W/chest  02/27/2015  CLINICAL DATA:  Generalized abdominal pain, nausea and vomiting. Constipation. EXAM: DG ABDOMEN ACUTE W/ 1V CHEST COMPARISON:  Report from 05/12/2011 chest radiograph (images not available). FINDINGS: Normal heart size. Normal mediastinal contour. No pneumothorax. No pleural effusion. Lungs appear clear, with no acute consolidative airspace disease and no pulmonary edema. No disproportionately dilated small bowel loops to suggest small bowel obstruction. Large stool volume throughout the colon. No evidence of pneumatosis or pneumoperitoneum. 6 mm calcification in the right upper quadrant. Nonspecific tiny round calcifications in the bilateral deep pelvis, favor calcified venous phleboliths. IMPRESSION: 1. No active disease in the chest. 2. No evidence of small-bowel obstruction. 3. Large colonic stool volume, suggesting constipation. 4. Right upper quadrant 6 mm calcification, favor right renal stone. Electronically Signed   By: Delbert Phenix M.D.   On: 02/27/2015 22:02   I have personally reviewed and evaluated these images and lab  results as part of my medical decision-making.   EKG Interpretation None      MDM   Final diagnoses:  Constipation, unspecified constipation type    No acute abdomen. CT scan reviewed. No bowel obstruction noted. Small bowel is not distended. Diffuse stool and gas in the colon. Recommend enemas, Dulcolax, magnesium citrate, return if worse.  I personally performed the services described in this documentation, which was scribed in my presence. The recorded information has been reviewed and is accurate.    Donnetta Hutching, MD 02/28/15 [phone]

## 2015-02-27 NOTE — ED Notes (Signed)
Pt states that his last normal bowel movement was 32 days ago, started having abd pain with n/v yesterday,

## 2015-02-28 MED ORDER — IOHEXOL 300 MG/ML  SOLN
100.0000 mL | Freq: Once | INTRAMUSCULAR | Status: AC | PRN
  Administered 2015-02-28: 100 mL via INTRAVENOUS

## 2015-02-28 NOTE — Discharge Instructions (Signed)
CT scan showed no life-threatening condition. Recommend enemas with your left side down. Also can take over-the-counter stool softeners. Other options are magnesium citrate which is a beverage for constipation.  Return here if worse in anyway.

## 2017-07-03 IMAGING — DX DG ABDOMEN ACUTE W/ 1V CHEST
3 series · 3 of 3 positions shown · non-contrast
Comparison: Report from 05/12/2011 chest radiograph (images not
available).

CLINICAL DATA: Generalized abdominal pain, nausea and vomiting.
Constipation.

EXAM:
DG ABDOMEN ACUTE W/ 1V CHEST

[chest pa]
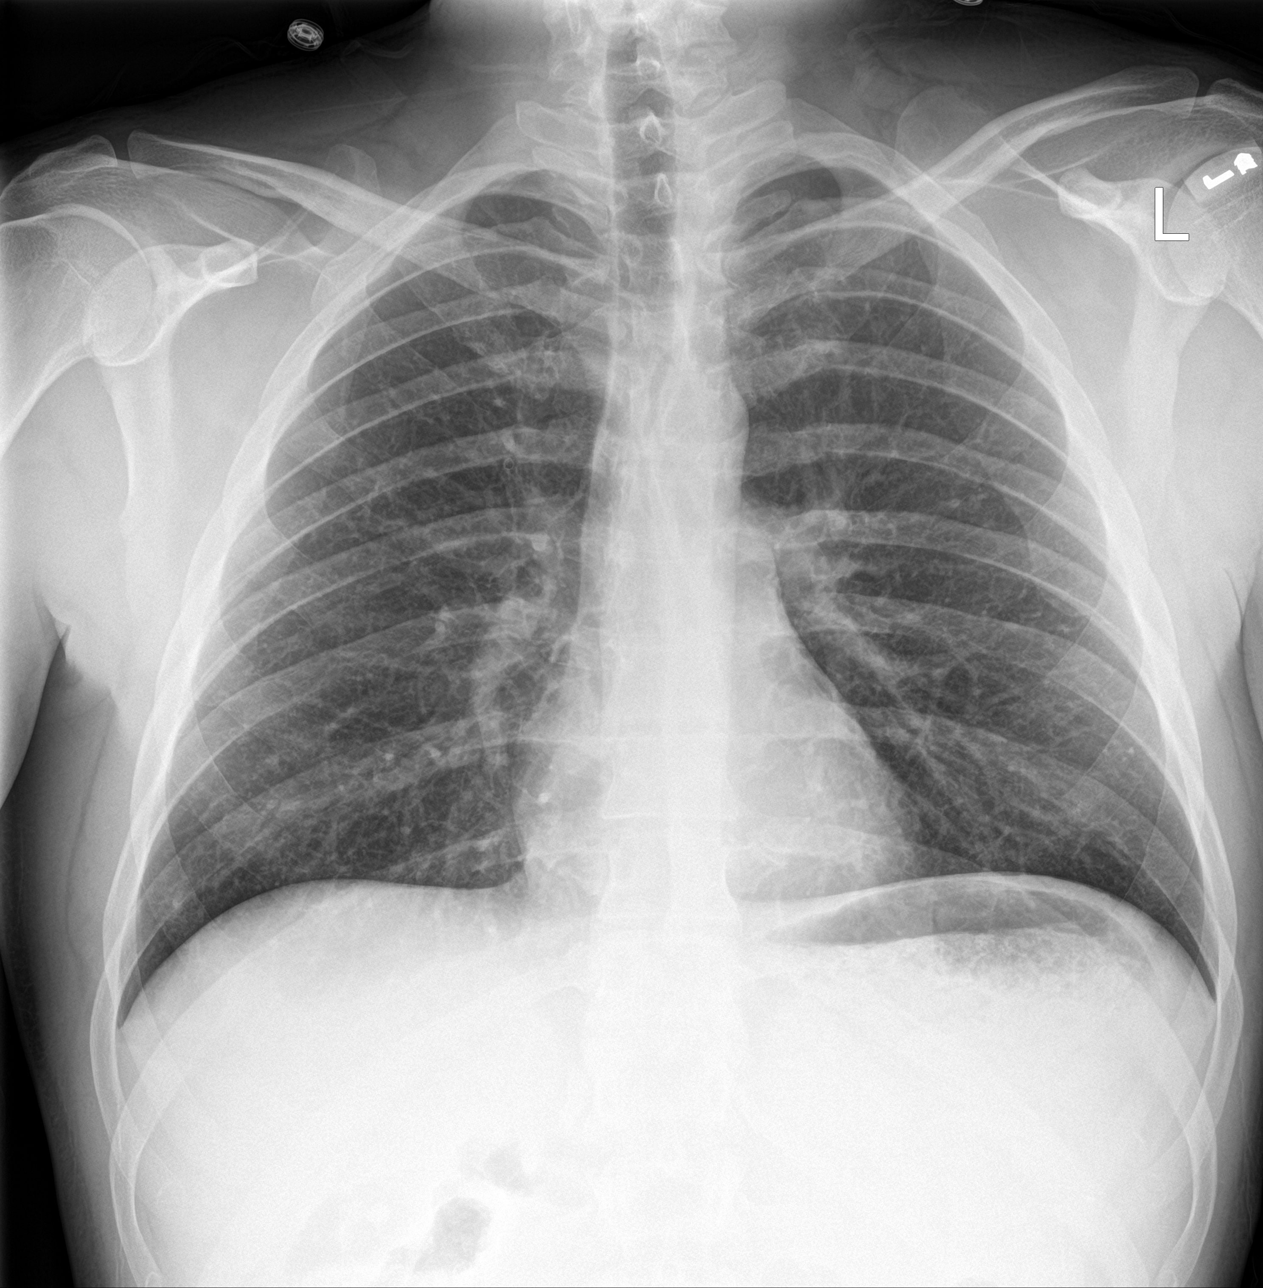

[abdomen erect]
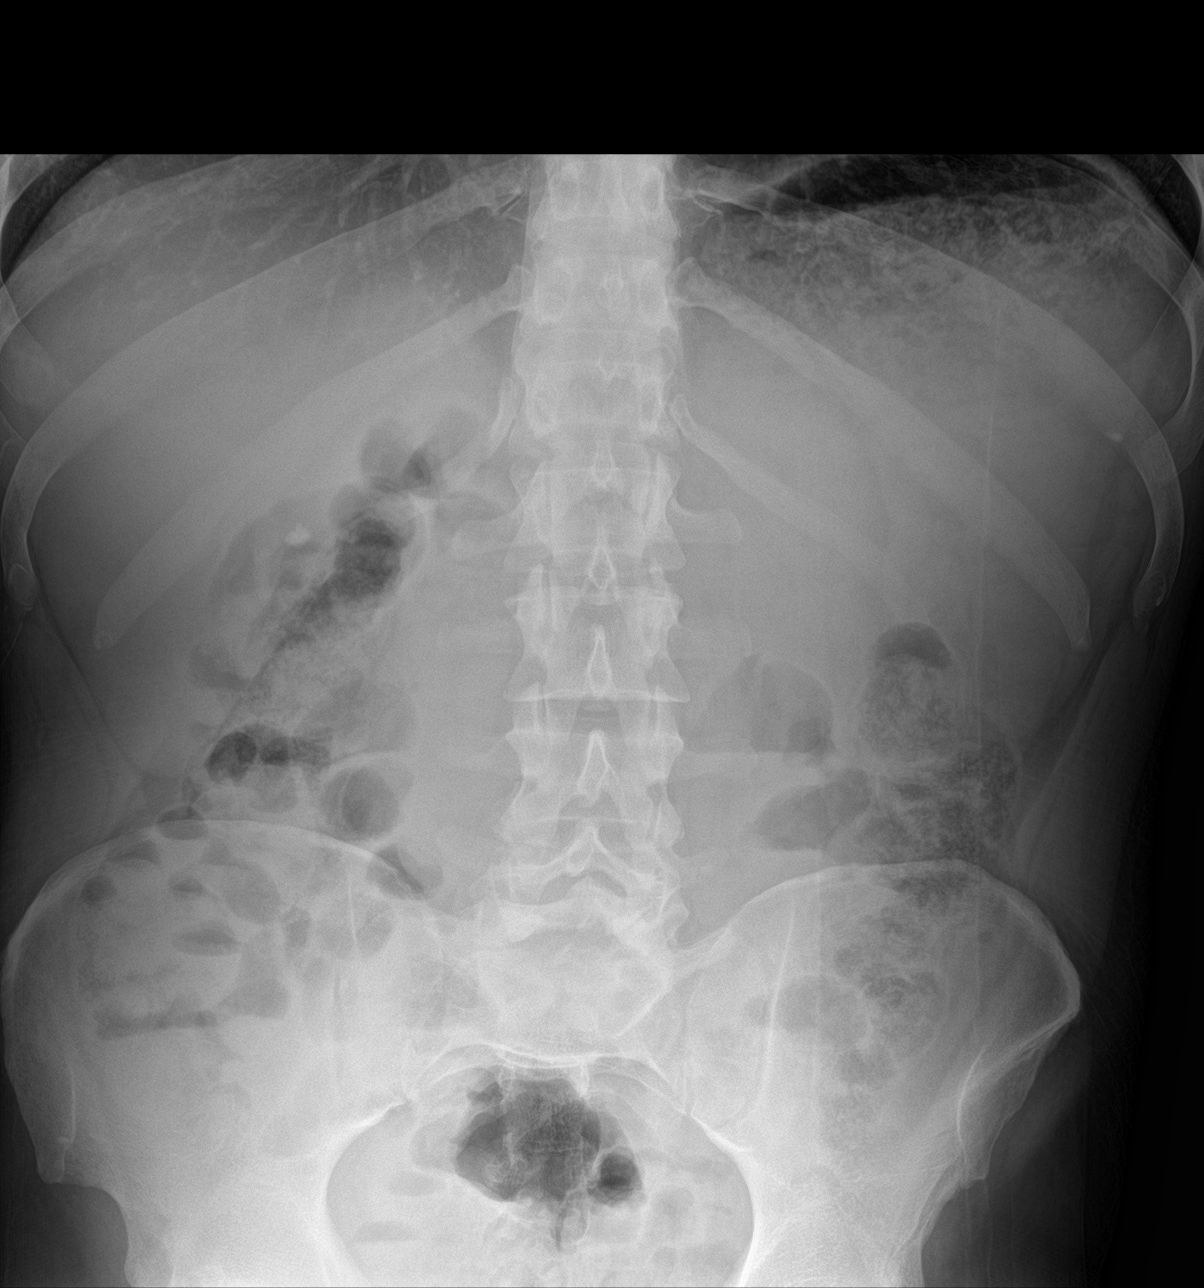

[abdomen supine]
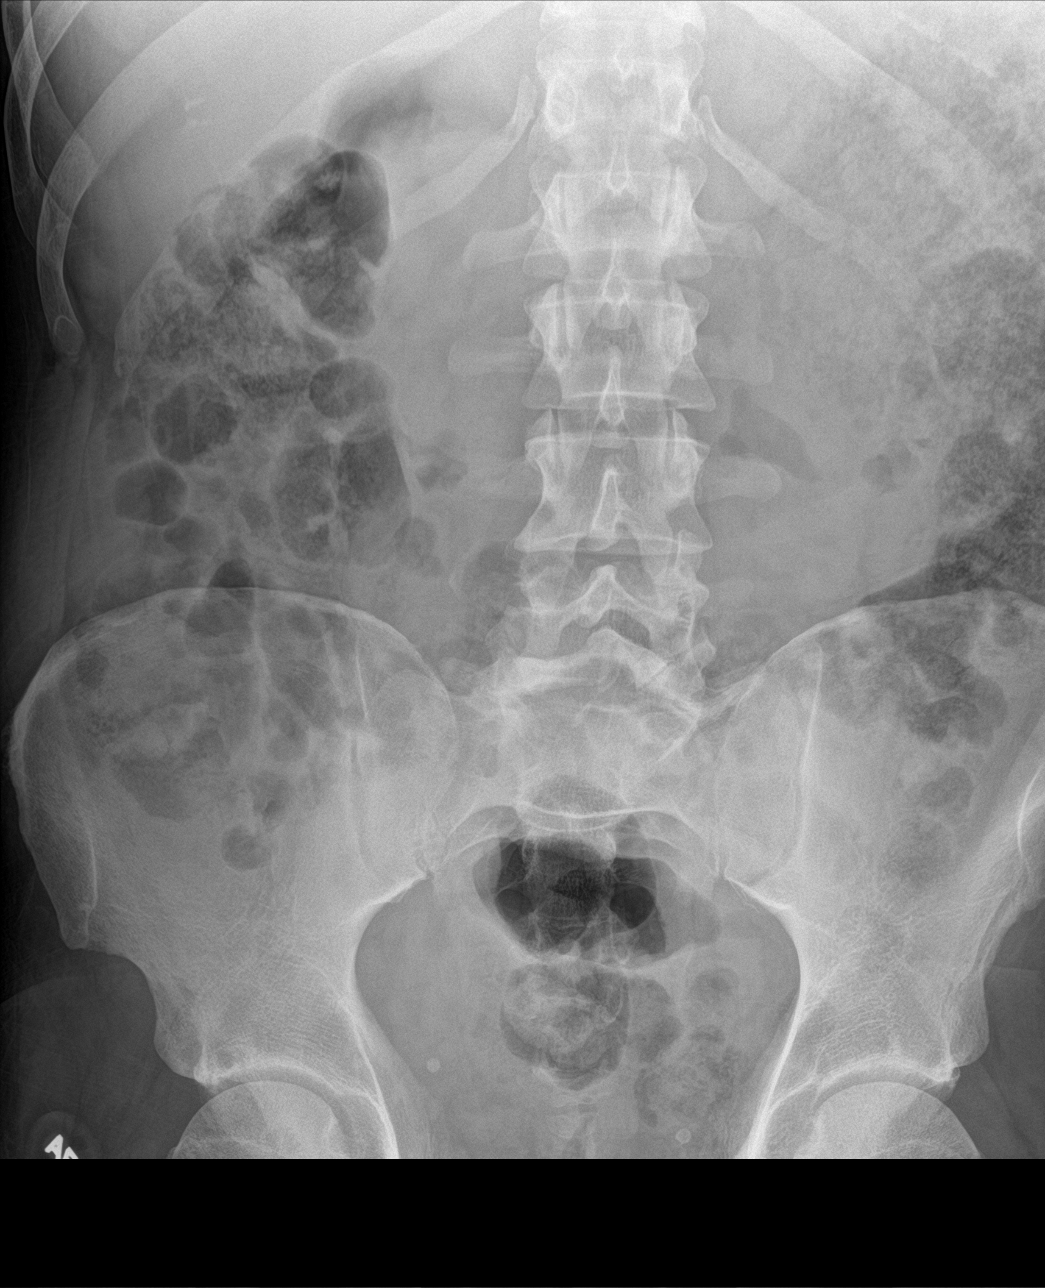

[3 of 3 positions shown; findings below may reference images not displayed]

FINDINGS: Normal heart size. Normal mediastinal contour. No pneumothorax. No
pleural effusion. Lungs appear clear, with no acute consolidative
airspace disease and no pulmonary edema. No disproportionately
dilated small bowel loops to suggest small bowel obstruction. Large
stool volume throughout the colon. No evidence of pneumatosis or
pneumoperitoneum. 6 mm calcification in the right upper quadrant.
Nonspecific tiny round calcifications in the bilateral deep pelvis,
favor calcified venous phleboliths.
IMPRESSION: 1. No active disease in the chest.
2. No evidence of small-bowel obstruction.
3. Large colonic stool volume, suggesting constipation.
4. Right upper quadrant 6 mm calcification, favor right renal stone.
